# Patient Record
Sex: Female | Born: 1995 | Race: Black or African American | Hispanic: No | Marital: Single | Smoking: Never smoker
Health system: Southern US, Community
[De-identification: ages and names within clinical notes are randomized; demographics above are authoritative.]

---
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
## 2016-11-22 ENCOUNTER — Encounter (HOSPITAL_COMMUNITY): Payer: Self-pay | Admitting: Emergency Medicine

## 2016-11-22 ENCOUNTER — Emergency Department (HOSPITAL_COMMUNITY)
Admission: EM | Admit: 2016-11-22 | Discharge: 2016-11-22 | Disposition: A | Discharge status: Home / Self Care | Payer: Self-pay | Attending: Emergency Medicine | Admitting: Emergency Medicine

## 2016-11-22 DIAGNOSIS — L0501 Pilonidal cyst with abscess: Secondary | ICD-10-CM | POA: Insufficient documentation

## 2016-11-22 MED ORDER — LIDOCAINE-EPINEPHRINE (PF) 1 %-1:200000 IJ SOLN
20.0000 mL | Freq: Once | INTRAMUSCULAR | Status: DC
  Filled 2016-11-22: qty 30

## 2016-11-22 MED ORDER — HYDROCODONE-ACETAMINOPHEN 5-325 MG PO TABS: 1.0000 | ORAL_TABLET | Freq: Four times a day (QID) | ORAL | 0 refills | Status: AC | PRN

## 2016-11-22 MED ORDER — DOXYCYCLINE HYCLATE 100 MG PO CAPS: 100.0000 mg | ORAL_CAPSULE | Freq: Two times a day (BID) | ORAL | 0 refills | Status: AC

## 2016-11-22 MED ORDER — HYDROCODONE-ACETAMINOPHEN 5-325 MG PO TABS
2.0000 | ORAL_TABLET | Freq: Once | ORAL | Status: AC
  Administered 2016-11-22: 2 via ORAL
  Filled 2016-11-22: qty 2

## 2016-11-22 NOTE — ED Provider Notes (Signed)
[facility]EM[facility] DEPT Provider Note   CSN: [id number] Arrival date & time: 11/22/16  0407     History   Chief Complaint Chief Complaint  Patient presents with  . Abscess    HPI Rose Hull is a 21 y.o. female.  Patient presents to the emergency department with chief complaint of abscess. She reports that she has a firm tender bump on her backside which has been present for the past 3-4 days. She states that she may have been stung by bee. She denies any fevers, chills, nausea, vomiting, or diarrhea. She states that the symptoms are worsened with palpation and movement. She denies having taken anything for her symptoms. There are no other associated symptoms.   The history is provided by the patient. No language interpreter was used.    History reviewed. No pertinent past medical history.  There are no active problems to display for this patient.   History reviewed. No pertinent surgical history.  OB History    No data available       Home Medications    Prior to Admission medications   Medication Sig Start Date End Date Taking? Authorizing Provider  albuterol (PROVENTIL HFA;VENTOLIN HFA) 108 (90 BASE) MCG/ACT inhaler 2 puffs 30 minutes prior to exercise. 01/25/12 02/25/12  Lucio Edward, MD    Family History No family history on file.  Social History Social History  Substance Use Topics  . Smoking status: Never Smoker  . Smokeless tobacco: Never Used  . Alcohol use No     Allergies   Patient has no known allergies.   Review of Systems Review of Systems  All other systems reviewed and are negative.    Physical Exam Updated Vital Signs BP 108/63   Pulse 89   Temp 98.3 F (36.8 C) (Oral)   Resp 16   Ht  (1.626 m)   Wt 104.3 kg (230 lb)   LMP 11/03/2016   SpO2 100%   BMI 39.48 kg/m   Physical Exam Physical Exam  Constitutional: Pt is oriented to person, place, and time. Pt appears well-developed and well-nourished. No distress.    HENT:  Head: Normocephalic and atraumatic.  Eyes: Conjunctivae are normal. No scleral icterus.  Neck: Normal range of motion.  Cardiovascular: Normal rate, regular rhythm and intact distal pulses.   Pulmonary/Chest: Effort normal and breath sounds normal.  Abdominal: Soft. Pt exhibits no distension. There is no tenderness.  Lymphadenopathy:    Pt has no cervical adenopathy.  Neurological: Pt is alert and oriented to person, place, and time.  Skin: Skin is warm and dry. Pt is not diaphoretic. There is erythema, induration, and tenderness over the sacrum.  Psychiatric: Pt has a normal mood and affect.  Nursing note and vitals reviewed.    ED Treatments / Results  Labs (all labs ordered are listed, but only abnormal results are displayed) Labs Reviewed - No data to display  EKG  EKG Interpretation None       Radiology No results found.  Procedures .Marland KitchenIncision and Drainage Date/Time: 11/22/2016 4:44 AM Performed by: Melody Comas Authorized by: Roxy Horseman   Consent:    Consent obtained:  Verbal   Consent given by:  Patient   Risks discussed:  Bleeding, incomplete drainage, pain and infection   Alternatives discussed:  No treatment Location:    Type:  Pilonidal cyst   Location:  Anogenital   Anogenital location:  Pilonidal Pre-procedure details:    Skin preparation:  Betadine Anesthesia (see MAR for exact  dosages):    Anesthesia method:  Local infiltration   Local anesthetic:  Lidocaine 1% WITH epi Procedure type:    Complexity:  Complex Procedure details:    Incision types:  Single straight   Scalpel blade:  11   Drainage:  Purulent   Drainage amount:  Copious   Packing materials:  1/4 in iodoform gauze   Amount 1/4" iodoform:  3" Post-procedure details:    Patient tolerance of procedure:  Tolerated well, no immediate complications    (including critical care time)  Medications Ordered in ED Medications  lidocaine-EPINEPHrine  (XYLOCAINE-EPINEPHrine) 1 %-1:200000 (PF) injection 20 mL (not administered)  HYDROcodone-acetaminophen (NORCO/VICODIN) 5-325 MG per tablet 2 tablet (2 tablets Oral Given 11/22/16 0436)     Initial Impression / Assessment and Plan / ED Course  I have reviewed the triage vital signs and the nursing notes.  Pertinent labs & imaging results that were available during my care of the patient were reviewed by me and considered in my medical decision making (see chart for details).     Patient with pilonidal abscess amenable to incision and drainage.  Abscess was not large enough to warrant packing or drain,  wound recheck in 2 days. Encouraged home warm soaks and flushing.  Mild signs of cellulitis is surrounding skin.  Will d/c to home.    Final Clinical Impressions(s) / ED Diagnoses   Final diagnoses:  Pilonidal abscess    New Prescriptions New Prescriptions   DOXYCYCLINE (VIBRAMYCIN) 100 MG CAPSULE    Take 1 capsule (100 mg total) by mouth 2 (two) times daily.   HYDROCODONE-ACETAMINOPHEN (NORCO/VICODIN) 5-325 MG TABLET    Take 1-2 tablets by mouth every 6 (six) hours as needed.     Roxy Horseman, PA-C 11/22/16 0502    Gilda Crease, MD 11/22/16 [phone]

## 2016-11-22 NOTE — ED Triage Notes (Signed)
Pt c/o abscess to crease of buttocks x 3-4 days, area warm, hard to touch, no drainage.

## 2017-10-01 ENCOUNTER — Emergency Department (HOSPITAL_COMMUNITY): Payer: Worker's Compensation

## 2017-10-01 ENCOUNTER — Emergency Department (HOSPITAL_COMMUNITY): Payer: Worker's Compensation | Admitting: Anesthesiology

## 2017-10-01 ENCOUNTER — Ambulatory Visit (HOSPITAL_COMMUNITY)
Admission: EM | Admit: 2017-10-01 | Discharge: 2017-10-01 | Disposition: A | Discharge status: Home / Self Care | Payer: Worker's Compensation | Attending: Emergency Medicine | Admitting: Emergency Medicine

## 2017-10-01 ENCOUNTER — Encounter (HOSPITAL_COMMUNITY)
Admission: EM | Disposition: A | Discharge status: Home / Self Care | Payer: Self-pay | Source: Home / Self Care | Attending: Emergency Medicine

## 2017-10-01 ENCOUNTER — Encounter (HOSPITAL_COMMUNITY): Payer: Self-pay | Admitting: Radiology

## 2017-10-01 DIAGNOSIS — R000 Tachycardia, unspecified: Secondary | ICD-10-CM | POA: Insufficient documentation

## 2017-10-01 DIAGNOSIS — S52302A Unspecified fracture of shaft of left radius, initial encounter for closed fracture: Secondary | ICD-10-CM | POA: Insufficient documentation

## 2017-10-01 DIAGNOSIS — Z23 Encounter for immunization: Secondary | ICD-10-CM | POA: Diagnosis not present

## 2017-10-01 DIAGNOSIS — S52202A Unspecified fracture of shaft of left ulna, initial encounter for closed fracture: Secondary | ICD-10-CM | POA: Diagnosis not present

## 2017-10-01 DIAGNOSIS — S5292XA Unspecified fracture of left forearm, initial encounter for closed fracture: Secondary | ICD-10-CM

## 2017-10-01 DIAGNOSIS — T07XXXA Unspecified multiple injuries, initial encounter: Secondary | ICD-10-CM

## 2017-10-01 DIAGNOSIS — Y92410 Unspecified street and highway as the place of occurrence of the external cause: Secondary | ICD-10-CM | POA: Insufficient documentation

## 2017-10-01 HISTORY — PX: ORIF ULNAR FRACTURE: SHX5417

## 2017-10-01 HISTORY — PX: ORIF RADIAL FRACTURE: SHX5113

## 2017-10-01 LAB — URINALYSIS, ROUTINE W REFLEX MICROSCOPIC
Bilirubin Urine: NEGATIVE
GLUCOSE, UA: NEGATIVE mg/dL
HGB URINE DIPSTICK: NEGATIVE
Ketones, ur: 5 mg/dL — AB
Leukocytes, UA: NEGATIVE
NITRITE: NEGATIVE
Protein, ur: 30 mg/dL — AB
Specific Gravity, Urine: >1.046 — ABNORMAL HIGH (ref 1.005–1.030)
pH: 5 (ref 5.0–8.0)

## 2017-10-01 LAB — I-STAT BETA HCG BLOOD, ED (MC, WL, AP ONLY)

## 2017-10-01 LAB — I-STAT CG4 LACTIC ACID, ED: LACTIC ACID, VENOUS: 1.67 mmol/L (ref 0.5–1.9)

## 2017-10-01 LAB — PREPARE FRESH FROZEN PLASMA
UNIT DIVISION: 0
UNIT DIVISION: 0

## 2017-10-01 LAB — PROTIME-INR
INR: 0.97
Prothrombin Time: 12.8 seconds (ref 11.4–15.2)

## 2017-10-01 LAB — CBC
HCT: 39.5 % (ref 36.0–46.0)
Hemoglobin: 12.8 g/dL (ref 12.0–15.0)
MCH: 29.5 pg (ref 26.0–34.0)
MCHC: 32.4 g/dL (ref 30.0–36.0)
MCV: 91 fL (ref 78.0–100.0)
PLATELETS: 380 10*3/uL (ref 150–400)
RBC: 4.34 MIL/uL (ref 3.87–5.11)
RDW: 12.8 % (ref 11.5–15.5)
WBC: 16 10*3/uL — AB (ref 4.0–10.5)

## 2017-10-01 LAB — COMPREHENSIVE METABOLIC PANEL
ALT: 21 U/L (ref 0–44)
AST: 28 U/L (ref 15–41)
Albumin: 3.8 g/dL (ref 3.5–5.0)
Alkaline Phosphatase: 69 U/L (ref 38–126)
Anion gap: 8 (ref 5–15)
BILIRUBIN TOTAL: 0.7 mg/dL (ref 0.3–1.2)
BUN: 10 mg/dL (ref 6–20)
CALCIUM: 9.1 mg/dL (ref 8.9–10.3)
CO2: 20 mmol/L — ABNORMAL LOW (ref 22–32)
CREATININE: 1.09 mg/dL — AB (ref 0.44–1.00)
Chloride: 107 mmol/L (ref 98–111)
GFR calc Af Amer: >60 mL/min (ref >60)
Glucose, Bld: 137 mg/dL — ABNORMAL HIGH (ref 70–99)
Potassium: 3.3 mmol/L — ABNORMAL LOW (ref 3.5–5.1)
Sodium: 135 mmol/L (ref 135–145)
TOTAL PROTEIN: 7.3 g/dL (ref 6.5–8.1)

## 2017-10-01 LAB — I-STAT CHEM 8, ED
BUN: 11 mg/dL (ref 6–20)
CREATININE: 1 mg/dL (ref 0.44–1.00)
Calcium, Ion: 1.2 mmol/L (ref 1.15–1.40)
Chloride: 106 mmol/L (ref 98–111)
GLUCOSE: 132 mg/dL — AB (ref 70–99)
HEMATOCRIT: 39 % (ref 36.0–46.0)
Hemoglobin: 13.3 g/dL (ref 12.0–15.0)
POTASSIUM: 3.3 mmol/L — AB (ref 3.5–5.1)
Sodium: 139 mmol/L (ref 135–145)
TCO2: 19 mmol/L — ABNORMAL LOW (ref 22–32)

## 2017-10-01 LAB — BPAM FFP
BLOOD PRODUCT EXPIRATION DATE: 201908312359
BLOOD PRODUCT EXPIRATION DATE: 201909022359
ISSUE DATE / TIME: 201908170920
ISSUE DATE / TIME: 201908170920
Unit Type and Rh: 6200
Unit Type and Rh: 6200

## 2017-10-01 LAB — BLOOD PRODUCT ORDER (VERBAL) VERIFICATION

## 2017-10-01 LAB — ABO/RH: ABO/RH(D): A POS

## 2017-10-01 LAB — ETHANOL: Alcohol, Ethyl (B): <10 mg/dL (ref <10)

## 2017-10-01 LAB — CDS SEROLOGY

## 2017-10-01 SURGERY — OPEN REDUCTION INTERNAL FIXATION (ORIF) RADIAL FRACTURE
Anesthesia: General | Site: Arm Lower | Laterality: Left

## 2017-10-01 MED ORDER — FENTANYL CITRATE (PF) 250 MCG/5ML IJ SOLN
INTRAMUSCULAR | Status: AC
  Filled 2017-10-01: qty 5

## 2017-10-01 MED ORDER — MORPHINE SULFATE (PF) 4 MG/ML IV SOLN
4.0000 mg | Freq: Once | INTRAVENOUS | Status: AC
  Administered 2017-10-01: 4 mg via INTRAVENOUS
  Filled 2017-10-01: qty 1

## 2017-10-01 MED ORDER — MIDAZOLAM HCL 5 MG/5ML IJ SOLN
INTRAMUSCULAR | Status: DC | PRN
  Administered 2017-10-01: 2 mg via INTRAVENOUS

## 2017-10-01 MED ORDER — PHENYLEPHRINE HCL 10 MG/ML IJ SOLN
INTRAMUSCULAR | Status: DC | PRN
  Administered 2017-10-01: 25 ug/min via INTRAVENOUS

## 2017-10-01 MED ORDER — OXYCODONE-ACETAMINOPHEN 5-325 MG PO TABS: 1.0000 | ORAL_TABLET | ORAL | 0 refills | Status: AC | PRN

## 2017-10-01 MED ORDER — ACETAMINOPHEN 10 MG/ML IV SOLN
INTRAVENOUS | Status: AC
  Filled 2017-10-01: qty 100

## 2017-10-01 MED ORDER — PROPOFOL 10 MG/ML IV BOLUS
INTRAVENOUS | Status: AC
  Filled 2017-10-01: qty 20

## 2017-10-01 MED ORDER — DEXAMETHASONE SODIUM PHOSPHATE 10 MG/ML IJ SOLN
INTRAMUSCULAR | Status: DC | PRN
  Administered 2017-10-01: 8 mg via INTRAVENOUS

## 2017-10-01 MED ORDER — SODIUM CHLORIDE 0.9 % IV BOLUS
1000.0000 mL | Freq: Once | INTRAVENOUS | Status: AC
  Administered 2017-10-01: 1000 mL via INTRAVENOUS

## 2017-10-01 MED ORDER — 0.9 % SODIUM CHLORIDE (POUR BTL) OPTIME
TOPICAL | Status: DC | PRN
  Administered 2017-10-01: 1000 mL

## 2017-10-01 MED ORDER — LACTATED RINGERS IV SOLN
INTRAVENOUS | Status: DC | PRN
  Administered 2017-10-01: 16:00:00 via INTRAVENOUS

## 2017-10-01 MED ORDER — CEFAZOLIN SODIUM-DEXTROSE 1-4 GM/50ML-% IV SOLN
INTRAVENOUS | Status: DC | PRN
  Administered 2017-10-01: 2 g via INTRAVENOUS

## 2017-10-01 MED ORDER — IOPAMIDOL (ISOVUE-300) INJECTION 61%
100.0000 mL | Freq: Once | INTRAVENOUS | Status: AC | PRN
  Administered 2017-10-01: 100 mL via INTRAVENOUS

## 2017-10-01 MED ORDER — GLYCOPYRROLATE PF 0.2 MG/ML IJ SOSY
PREFILLED_SYRINGE | INTRAMUSCULAR | Status: AC
  Filled 2017-10-01: qty 3

## 2017-10-01 MED ORDER — FENTANYL CITRATE (PF) 100 MCG/2ML IJ SOLN
INTRAMUSCULAR | Status: AC
  Filled 2017-10-01: qty 2

## 2017-10-01 MED ORDER — NEOSTIGMINE METHYLSULFATE 5 MG/5ML IV SOSY
PREFILLED_SYRINGE | INTRAVENOUS | Status: AC
  Filled 2017-10-01: qty 5

## 2017-10-01 MED ORDER — BUPIVACAINE-EPINEPHRINE (PF) 0.5% -1:200000 IJ SOLN
INTRAMUSCULAR | Status: DC | PRN
  Administered 2017-10-01: 30 mL via PERINEURAL

## 2017-10-01 MED ORDER — PHENYLEPHRINE 40 MCG/ML (10ML) SYRINGE FOR IV PUSH (FOR BLOOD PRESSURE SUPPORT)
PREFILLED_SYRINGE | INTRAVENOUS | Status: AC
  Filled 2017-10-01: qty 10

## 2017-10-01 MED ORDER — ONDANSETRON HCL 4 MG/2ML IJ SOLN
INTRAMUSCULAR | Status: DC | PRN
  Administered 2017-10-01: 4 mg via INTRAVENOUS

## 2017-10-01 MED ORDER — MIDAZOLAM HCL 2 MG/2ML IJ SOLN
INTRAMUSCULAR | Status: AC
  Filled 2017-10-01: qty 2

## 2017-10-01 MED ORDER — TETANUS-DIPHTH-ACELL PERTUSSIS 5-2.5-18.5 LF-MCG/0.5 IM SUSP
0.5000 mL | Freq: Once | INTRAMUSCULAR | Status: AC
  Administered 2017-10-01: 0.5 mL via INTRAMUSCULAR
  Filled 2017-10-01: qty 0.5

## 2017-10-01 MED ORDER — DEXAMETHASONE SODIUM PHOSPHATE 10 MG/ML IJ SOLN
INTRAMUSCULAR | Status: AC
  Filled 2017-10-01: qty 1

## 2017-10-01 MED ORDER — LIDOCAINE 2% (20 MG/ML) 5 ML SYRINGE
INTRAMUSCULAR | Status: AC
  Filled 2017-10-01: qty 5

## 2017-10-01 MED ORDER — LIDOCAINE 2% (20 MG/ML) 5 ML SYRINGE
INTRAMUSCULAR | Status: DC | PRN
  Administered 2017-10-01: 80 mg via INTRAVENOUS

## 2017-10-01 MED ORDER — ALBUMIN HUMAN 5 % IV SOLN
INTRAVENOUS | Status: DC | PRN
  Administered 2017-10-01 (×2): via INTRAVENOUS

## 2017-10-01 MED ORDER — ESMOLOL HCL 100 MG/10ML IV SOLN
INTRAVENOUS | Status: DC | PRN
  Administered 2017-10-01: 20 mg via INTRAVENOUS
  Administered 2017-10-01: 10 mg via INTRAVENOUS
  Administered 2017-10-01: 20 mg via INTRAVENOUS
  Administered 2017-10-01: 10 mg via INTRAVENOUS

## 2017-10-01 MED ORDER — PHENYLEPHRINE HCL 10 MG/ML IJ SOLN
INTRAMUSCULAR | Status: DC | PRN
  Administered 2017-10-01: 120 ug via INTRAVENOUS
  Administered 2017-10-01 (×2): 80 ug via INTRAVENOUS

## 2017-10-01 MED ORDER — BUPIVACAINE HCL (PF) 0.25 % IJ SOLN
INTRAMUSCULAR | Status: AC
  Filled 2017-10-01: qty 30

## 2017-10-01 MED ORDER — OXYCODONE-ACETAMINOPHEN 5-325 MG PO TABS: 2.0000 | ORAL_TABLET | Freq: Once | ORAL | Status: DC

## 2017-10-01 MED ORDER — ROCURONIUM BROMIDE 50 MG/5ML IV SOSY
PREFILLED_SYRINGE | INTRAVENOUS | Status: DC | PRN
  Administered 2017-10-01: 30 mg via INTRAVENOUS

## 2017-10-01 MED ORDER — SUCCINYLCHOLINE CHLORIDE 20 MG/ML IJ SOLN
INTRAMUSCULAR | Status: DC | PRN
  Administered 2017-10-01: 140 mg via INTRAVENOUS

## 2017-10-01 MED ORDER — FENTANYL CITRATE (PF) 100 MCG/2ML IJ SOLN
INTRAMUSCULAR | Status: DC | PRN
  Administered 2017-10-01: 50 ug via INTRAVENOUS
  Administered 2017-10-01: 100 ug via INTRAVENOUS
  Administered 2017-10-01 (×3): 50 ug via INTRAVENOUS

## 2017-10-01 MED ORDER — ONDANSETRON HCL 4 MG/2ML IJ SOLN
INTRAMUSCULAR | Status: AC
  Filled 2017-10-01: qty 2

## 2017-10-01 MED ORDER — SODIUM CHLORIDE 0.9 % IV SOLN
INTRAVENOUS | Status: AC | PRN
  Administered 2017-10-01 (×2): 1000 mL via INTRAVENOUS

## 2017-10-01 MED ORDER — ACETAMINOPHEN 10 MG/ML IV SOLN
INTRAVENOUS | Status: DC | PRN
  Administered 2017-10-01: 1000 mg via INTRAVENOUS

## 2017-10-01 MED ORDER — ESMOLOL HCL 100 MG/10ML IV SOLN
INTRAVENOUS | Status: AC
  Filled 2017-10-01: qty 10

## 2017-10-01 MED ORDER — ROCURONIUM BROMIDE 50 MG/5ML IV SOSY
PREFILLED_SYRINGE | INTRAVENOUS | Status: AC
  Filled 2017-10-01: qty 5

## 2017-10-01 MED ORDER — IOPAMIDOL (ISOVUE-300) INJECTION 61%
INTRAVENOUS | Status: DC
Start: 2017-10-01 — End: 2017-10-01
  Filled 2017-10-01: qty 100

## 2017-10-01 MED ORDER — CEFAZOLIN SODIUM-DEXTROSE 2-4 GM/100ML-% IV SOLN
INTRAVENOUS | Status: AC
  Filled 2017-10-01: qty 100

## 2017-10-01 MED ORDER — FENTANYL CITRATE (PF) 100 MCG/2ML IJ SOLN
INTRAMUSCULAR | Status: AC | PRN
  Administered 2017-10-01 (×2): 50 ug via INTRAVENOUS

## 2017-10-01 MED ORDER — PROPOFOL 10 MG/ML IV BOLUS
INTRAVENOUS | Status: DC | PRN
  Administered 2017-10-01: 300 mg via INTRAVENOUS

## 2017-10-01 MED ORDER — SUGAMMADEX SODIUM 200 MG/2ML IV SOLN
INTRAVENOUS | Status: DC | PRN
  Administered 2017-10-01: 300 mg via INTRAVENOUS

## 2017-10-01 SURGICAL SUPPLY — 60 items
BANDAGE ACE 3X5.8 VEL STRL LF (GAUZE/BANDAGES/DRESSINGS) ×3 IMPLANT
BANDAGE ACE 4X5 VEL STRL LF (GAUZE/BANDAGES/DRESSINGS) ×3 IMPLANT
BIT DRILL 2.5X2.75 QC CALB (BIT) ×3 IMPLANT
BNDG CMPR 9X4 STRL LF SNTH (GAUZE/BANDAGES/DRESSINGS) ×1
BNDG ESMARK 4X9 LF (GAUZE/BANDAGES/DRESSINGS) ×3 IMPLANT
BNDG GAUZE ELAST 4 BULKY (GAUZE/BANDAGES/DRESSINGS) ×3 IMPLANT
CLOSURE WOUND 1/2 X4 (GAUZE/BANDAGES/DRESSINGS)
CORDS BIPOLAR (ELECTRODE) ×3 IMPLANT
COVER SURGICAL LIGHT HANDLE (MISCELLANEOUS) ×3 IMPLANT
CUFF TOURNIQUET SINGLE 18IN (TOURNIQUET CUFF) IMPLANT
DRAPE OEC MINIVIEW 54X84 (DRAPES) ×3 IMPLANT
DRAPE SURG 17X23 STRL (DRAPES) ×3 IMPLANT
DURAPREP 26ML APPLICATOR (WOUND CARE) ×3 IMPLANT
ELECT REM PT RETURN 9FT ADLT (ELECTROSURGICAL)
ELECTRODE REM PT RTRN 9FT ADLT (ELECTROSURGICAL) IMPLANT
GAUZE SPONGE 4X4 12PLY STRL (GAUZE/BANDAGES/DRESSINGS) IMPLANT
GAUZE SPONGE 4X4 12PLY STRL LF (GAUZE/BANDAGES/DRESSINGS) ×3 IMPLANT
GAUZE XEROFORM 1X8 LF (GAUZE/BANDAGES/DRESSINGS) IMPLANT
GAUZE XEROFORM 5X9 LF (GAUZE/BANDAGES/DRESSINGS) ×3 IMPLANT
GLOVE SURG SYN 8.0 (GLOVE) ×3 IMPLANT
GOWN STRL REUS W/ TWL LRG LVL3 (GOWN DISPOSABLE) ×1 IMPLANT
GOWN STRL REUS W/ TWL XL LVL3 (GOWN DISPOSABLE) ×1 IMPLANT
GOWN STRL REUS W/TWL LRG LVL3 (GOWN DISPOSABLE) ×3
GOWN STRL REUS W/TWL XL LVL3 (GOWN DISPOSABLE) ×3
KIT BASIN OR (CUSTOM PROCEDURE TRAY) ×3 IMPLANT
KIT TURNOVER KIT B (KITS) ×3 IMPLANT
MANIFOLD NEPTUNE II (INSTRUMENTS) IMPLANT
NEEDLE 22X1 1/2 (OR ONLY) (NEEDLE) IMPLANT
NEEDLE HYPO 25GX1X1/2 BEV (NEEDLE) ×3 IMPLANT
NS IRRIG 1000ML POUR BTL (IV SOLUTION) ×3 IMPLANT
PACK ORTHO EXTREMITY (CUSTOM PROCEDURE TRAY) ×3 IMPLANT
PAD ARMBOARD 7.5X6 YLW CONV (MISCELLANEOUS) ×9 IMPLANT
PAD CAST 3X4 CTTN HI CHSV (CAST SUPPLIES) ×1 IMPLANT
PAD CAST 4YDX4 CTTN HI CHSV (CAST SUPPLIES) ×1 IMPLANT
PADDING CAST COTTON 3X4 STRL (CAST SUPPLIES) ×3
PADDING CAST COTTON 4X4 STRL (CAST SUPPLIES) ×3
PENCIL BUTTON HOLSTER BLD 10FT (ELECTRODE) IMPLANT
PLATE LOCK 6H 77 BILAT FIB (Plate) ×3 IMPLANT
PLATE LOCK COMP 6H FOOT (Plate) ×3 IMPLANT
SCREW CORTICAL 3.5MM  16MM (Screw) ×8 IMPLANT
SCREW CORTICAL 3.5MM 14MM (Screw) ×6 IMPLANT
SCREW CORTICAL 3.5MM 16MM (Screw) ×4 IMPLANT
SCREW NON LOCKING LP 3.5 14MM (Screw) ×12 IMPLANT
SCREW NON LOCKING LP 3.5 16MM (Screw) ×6 IMPLANT
SPLINT PLASTER CAST XFAST 5X30 (CAST SUPPLIES) ×1 IMPLANT
SPLINT PLASTER XFAST SET 5X30 (CAST SUPPLIES) ×2
SPONGE LAP 4X18 RFD (DISPOSABLE) IMPLANT
STRIP CLOSURE SKIN 1/2X4 (GAUZE/BANDAGES/DRESSINGS) IMPLANT
SUCTION FRAZIER HANDLE 10FR (MISCELLANEOUS) ×2
SUCTION TUBE FRAZIER 10FR DISP (MISCELLANEOUS) ×1 IMPLANT
SUT PROLENE 3 0 PS 2 (SUTURE) ×6 IMPLANT
SUT VIC AB 3-0 FS2 27 (SUTURE) ×6 IMPLANT
SUT VICRYL 4-0 PS2 18IN ABS (SUTURE) IMPLANT
SYR CONTROL 10ML LL (SYRINGE) ×3 IMPLANT
TOWEL OR 17X24 6PK STRL BLUE (TOWEL DISPOSABLE) ×3 IMPLANT
TOWEL OR 17X26 10 PK STRL BLUE (TOWEL DISPOSABLE) ×3 IMPLANT
TUBE CONNECTING 12'X1/4 (SUCTIONS) ×1
TUBE CONNECTING 12X1/4 (SUCTIONS) ×2 IMPLANT
UNDERPAD 30X30 (UNDERPADS AND DIAPERS) ×3 IMPLANT
WATER STERILE IRR 1000ML POUR (IV SOLUTION) ×3 IMPLANT

## 2017-10-01 NOTE — Op Note (Signed)
Please see dictated report #2 correction [id number]
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
## 2017-10-01 NOTE — Transfer of Care (Signed)
Immediate Anesthesia Transfer of Care Note  Patient: Rose Hull  Procedure(s) Performed: OPEN REDUCTION INTERNAL FIXATION (ORIF) RADIAL FRACTURE (Left Arm Lower) OPEN REDUCTION INTERNAL FIXATION (ORIF) ULNAR FRACTURE (Left Arm Lower)  Patient Location: PACU  Anesthesia Type:General  Level of Consciousness: awake and alert   Airway & Oxygen Therapy: Patient Spontanous Breathing and Patient connected to nasal cannula oxygen  Post-op Assessment: Report given to RN and Post -op Vital signs reviewed and stable  Post vital signs: Reviewed and stable  Last Vitals:  Vitals Value Taken Time  BP 134/61 10/01/2017  6:09 PM  Temp    Pulse 132 10/01/2017  6:14 PM  Resp 30 10/01/2017  6:14 PM  SpO2 95 % 10/01/2017  6:14 PM  Vitals shown include unvalidated device data.  Last Pain:  Vitals:   10/01/17 1230  PainSc: 8          Complications: No apparent anesthesia complications

## 2017-10-01 NOTE — Anesthesia Procedure Notes (Signed)
Procedure Name: Intubation Date/Time: 10/01/2017 4:15 PM Performed by: Inda Coke, CRNA Pre-anesthesia Checklist: Patient identified, Emergency Drugs available, Suction available and Patient being monitored Patient Re-evaluated:Patient Re-evaluated prior to induction Oxygen Delivery Method: Circle System Utilized Preoxygenation: Pre-oxygenation with 100% oxygen Induction Type: IV induction, Cricoid Pressure applied and Rapid sequence Ventilation: Mask ventilation without difficulty and Oral airway inserted - appropriate to patient size Laryngoscope Size: Mac and 4 Grade View: Grade I Tube type: Oral Number of attempts: 1 Airway Equipment and Method: Stylet and Oral airway Placement Confirmation: ETT inserted through vocal cords under direct vision,  positive ETCO2 and breath sounds checked- equal and bilateral Secured at: 22 cm Tube secured with: Tape Dental Injury: Teeth and Oropharynx as per pre-operative assessment

## 2017-10-01 NOTE — Progress Notes (Signed)
See procedure note for time out for block

## 2017-10-01 NOTE — Anesthesia Preprocedure Evaluation (Addendum)
Anesthesia Evaluation  Patient identified by MRN, date of birth, ID band Patient awake    Reviewed: Allergy & Precautions, NPO status , Patient's Chart, lab work & pertinent test results  History of Anesthesia Complications Negative for: history of anesthetic complications  Airway Mallampati: II  TM Distance: >3 FB Neck ROM: Full    Dental  (+) Dental Advisory Given, Teeth Intact   Pulmonary neg pulmonary ROS,    breath sounds clear to auscultation       Cardiovascular negative cardio ROS   Rhythm:Regular Rate:Tachycardia     Neuro/Psych  C/o tingling in all five fingers of left hand  negative psych ROS   GI/Hepatic negative GI ROS, Neg liver ROS,   Endo/Other  negative endocrine ROSMorbid obesity  Renal/GU negative Renal ROS  negative genitourinary   Musculoskeletal negative musculoskeletal ROS (+)   Abdominal (+) + obese,   Peds  Hematology negative hematology ROS (+)   Anesthesia Other Findings Hypokalemia  Reproductive/Obstetrics                            Anesthesia Physical Anesthesia Plan  ASA: III  Anesthesia Plan: General   Post-op Pain Management:  Regional for Post-op pain   Induction: Intravenous  PONV Risk Score and Plan: 3 and Treatment may vary due to age or medical condition, Ondansetron, Dexamethasone and Midazolam  Airway Management Planned: Oral ETT  Additional Equipment: None  Intra-op Plan:   Post-operative Plan: Extubation in OR  Informed Consent: I have reviewed the patients History and Physical, chart, labs and discussed the procedure including the risks, benefits and alternatives for the proposed anesthesia with the patient or authorized representative who has indicated his/her understanding and acceptance.   Dental advisory given  Plan Discussed with: CRNA, Anesthesiologist and Surgeon  Anesthesia Plan Comments: (Given preoperative sensory  changes on operative side, will delay regional block until postoperative period. )       Anesthesia Quick Evaluation

## 2017-10-01 NOTE — Progress Notes (Signed)
Pt to be discharged home - pt has denied any chest pain or SOB during PACU stay - bladder scan showed 346-350 of urine - pt states she will "go at home". Pt smiling when  stating pain was an 8/10. Pt's HR has remained at 130 both pre & post op - Dr Mal AmabileBrock stated pt " can go home -her heart rate was 130 before surgery" Pt's mother verbalized an understanding that pt was to return to hospital for any c/o chest pain, fever , SOB. Per pt's mother, pt's resting heart rate is in the "90's". "We check it at home when I check mine" pt's mother stated she (the mother had a cardiac history with a "genetic" high heart rate & had a defibrillator placed this past year) Per pt's mother, the plan is to have pt follow up next week with the mother's cardiologist. Dr Mina MarbleWeingold also verbalized an understanding that there was no change in pt HR of 130.

## 2017-10-01 NOTE — Anesthesia Procedure Notes (Signed)
Anesthesia Regional Block: Supraclavicular block   Pre-Anesthetic Checklist: ,, timeout performed, Correct Patient, Correct Site, Correct Laterality, Correct Procedure, Correct Position, site marked, Risks and benefits discussed,  Surgical consent,  Pre-op evaluation,  At surgeon's request and post-op pain management  Laterality: Left  Prep: chloraprep       Needles:  Injection technique: Single-shot  Needle Type: Echogenic Needle     Needle Length: 9cm  Needle Gauge: 21     Additional Needles:   Narrative:  Start time: 10/01/2017 7:01 PM End time: 10/01/2017 7:05 PM Injection made incrementally with aspirations every 5 mL.  Performed by: Personally  Anesthesiologist: Beryle LatheBrock, Thomas E, MD  Additional Notes: No pain on injection. No increased resistance to injection. Injection made in 5cc increments. Good needle visualization. Patient tolerated the procedure well.
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
## 2017-10-01 NOTE — ED Notes (Signed)
Family at bedside. 
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
## 2017-10-01 NOTE — ED Notes (Signed)
Pt's HR continues to be elevated in 130's despite 1 L fluid and pain medication.  MD notified and has ordered 2nd L NS.

## 2017-10-01 NOTE — ED Notes (Signed)
Trauma end  

## 2017-10-01 NOTE — Anesthesia Postprocedure Evaluation (Signed)
Anesthesia Post Note  Patient: [name]  Procedure(s) Performed: OPEN REDUCTION INTERNAL FIXATION (ORIF) RADIAL FRACTURE (Left Arm Lower) OPEN REDUCTION INTERNAL FIXATION (ORIF) ULNAR FRACTURE (Left Arm Lower)     Patient location during evaluation: PACU Anesthesia Type: General Level of consciousness: awake and alert Pain management: pain level controlled Vital Signs Assessment: post-procedure vital signs reviewed and stable Respiratory status: spontaneous breathing, nonlabored ventilation and respiratory function stable Cardiovascular status: blood pressure returned to baseline and stable Postop Assessment: no apparent nausea or vomiting Anesthetic complications: no Comments: After recovering from GA, patient complaining of significant pain in PACU. As discussed preop, patient requesting nerve block. Procedure performed, pain reduced to ~1/10 after nerve block.     Last Vitals:  Vitals:   10/01/17 1930 10/01/17 1932  BP:    Pulse: (!) 139 (!) 136  Resp: (!) 32 (!) 31  Temp: 37.2 C   SpO2: 97% 96%                  Beryle Lathehomas E Brock

## 2017-10-02 LAB — TYPE AND SCREEN
ABO/RH(D): A POS
Antibody Screen: NEGATIVE
Unit division: 0
Unit division: 0

## 2017-10-02 LAB — BPAM RBC
BLOOD PRODUCT EXPIRATION DATE: 201909202359
Blood Product Expiration Date: 201909202359
ISSUE DATE / TIME: 201908170920
ISSUE DATE / TIME: 201908170920
UNIT TYPE AND RH: 9500
UNIT TYPE AND RH: 9500

## 2017-10-02 NOTE — Op Note (Signed)
NAME: Hull, Rose L. MEDICAL RECORD [id number] ACCOUNT [id number] DATE OF BIRTH:Oct 01, 1995 FACILITY: [facility] LOCATION: [facility] PHYSICIAN:Tomiko Schoon A. Mina MarbleWEINGOLD, MD  OPERATIVE REPORT  DATE OF PROCEDURE:  10/01/2017  PREOPERATIVE DIAGNOSIS:  Displaced left radius and left ulna shaft fractures.  POSTOPERATIVE DIAGNOSIS:  Displaced left radius and left ulna shaft fractures.  PROCEDURE:  Open reduction internal fixation of left radius and left ulna midshaft fractures.   SURGEON:  Dairl PonderMatthew Junita Kubota, MD  ASSISTANT:  None.  ANESTHESIA:  General.  COMPLICATIONS:  No complications.  DRAINS:  No drains.  DESCRIPTION OF PROCEDURE:  The patient was taken to the operating suite after the induction of adequate general anesthetic.  Left upper extremity was prepped and draped in the usual sterile fashion.  An Esmarch was used to exsanguinate the limb and the  tourniquet was inflated to 250 mmHg.  At this point in time, incision was made on the palmar aspect of the forearm and wrist area on the left side.  Skin was incised sharply.  Dissection was carried down to the interval between the brachioradialis and  the flexor carpi radialis.  These two structures in the radial artery were retracted.  Blunt dissection was carried down to the fracture site.  The fracture site was debrided of clot.  Reduction was performed with reduction clamps.  Once reduction was  performed and maintained under fluoroscopic imaging, a 6-hole precontoured compression plate was placed with 3 cortical screws proximal and 3 cortical screws distal to the fracture site.  Fluoroscopic imaging revealed adequate reduction in multiple  views.  A second incision was made over the subcutaneous border of the ulna in the midshaft area.  Skin was incised sharply.  Dissection was carried down to the interval between the flexor carpi and extensor carpi ulnaris muscles.  Fracture site was  identified and debrided of clot.  Reduction was  performed.  A low contour 6-hole compression plate was placed across the fracture site with 3 cortical screws proximal and 3 cortical screws distal to the fracture.  This was also confirmed  fluoroscopically.  This wound was thoroughly irrigated.  Both wounds were closed in layers of 2-0 undyed Vicryl and a 3-0 Prolene subcuticular stitch on the skin.  Steri-Strips, 4 x 4 fluffs, and a volar splint was applied.  The patient tolerated these  procedures well and went to recovery room in stable fashion.  TN/NUANCE  D:10/01/2017 T:10/02/2017 JOB:[id number]

## 2017-10-03 ENCOUNTER — Encounter (HOSPITAL_COMMUNITY): Payer: Self-pay | Admitting: Emergency Medicine

## 2017-10-03 ENCOUNTER — Encounter (HOSPITAL_COMMUNITY): Payer: Self-pay | Admitting: Orthopedic Surgery

## 2018-11-30 ENCOUNTER — Other Ambulatory Visit: Payer: Self-pay

## 2018-11-30 DIAGNOSIS — Z20822 Contact with and (suspected) exposure to covid-19: Secondary | ICD-10-CM

## 2018-12-01 LAB — NOVEL CORONAVIRUS, NAA: SARS-CoV-2, NAA: NOT DETECTED

## 2018-12-05 ENCOUNTER — Other Ambulatory Visit: Payer: Self-pay

## 2018-12-05 DIAGNOSIS — Z20822 Contact with and (suspected) exposure to covid-19: Secondary | ICD-10-CM

## 2018-12-06 LAB — NOVEL CORONAVIRUS, NAA: SARS-CoV-2, NAA: NOT DETECTED
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
